# Patient Record
Sex: Male | Born: 1970 | Race: White | Hispanic: No | Marital: Married | Smoking: Former smoker
Health system: Southern US, Community
[De-identification: ages and names within clinical notes are randomized; demographics above are authoritative.]

## PROBLEM LIST (undated history)

## (undated) DIAGNOSIS — E7800 Pure hypercholesterolemia, unspecified: Secondary | ICD-10-CM

## (undated) DIAGNOSIS — G4730 Sleep apnea, unspecified: Secondary | ICD-10-CM

## (undated) DIAGNOSIS — K5900 Constipation, unspecified: Secondary | ICD-10-CM

## (undated) DIAGNOSIS — K429 Umbilical hernia without obstruction or gangrene: Secondary | ICD-10-CM

## (undated) HISTORY — PX: APPENDECTOMY: SHX54

---

## 1998-02-10 ENCOUNTER — Other Ambulatory Visit: Admission: RE | Admit: 1998-02-10 | Discharge: 1998-02-10 | Payer: Self-pay | Admitting: Obstetrics and Gynecology

## 2002-09-12 ENCOUNTER — Emergency Department (HOSPITAL_COMMUNITY): Admission: EM | Admit: 2002-09-12 | Discharge: 2002-09-12 | Payer: Self-pay | Admitting: Emergency Medicine

## 2002-09-12 ENCOUNTER — Encounter: Payer: Self-pay | Admitting: Emergency Medicine

## 2003-11-20 ENCOUNTER — Emergency Department (HOSPITAL_COMMUNITY): Admission: EM | Admit: 2003-11-20 | Discharge: 2003-11-21 | Payer: Self-pay | Admitting: Emergency Medicine

## 2006-03-18 ENCOUNTER — Emergency Department (HOSPITAL_COMMUNITY): Admission: EM | Admit: 2006-03-18 | Discharge: 2006-03-19 | Payer: Self-pay | Admitting: Emergency Medicine

## 2007-09-03 IMAGING — CT CT ABDOMEN W/ CM
2 of 5 series · 16 of 46 positions shown, 18 images · IV contrast (APPLIED)
Comparison: none

CLINICAL DATA: 35 year-old vomiting and abdominal pain.
ABDOMEN CT WITH CONTRAST:
TECHNIQUE: Multidetector CT imaging of the abdomen was performed following the standard protocol during bolus administration of intravenous contrast.
Contrast:  125 cc Omnipaque 300
TECHNIQUE: Multidetector CT imaging of the pelvis was performed following the standard protocol during bolus administration of intravenous contrast.
The rectum, sigmoid colon, and visualized small bowel loops demonstrate no significant findings.  There is a moderate amount of stool in the descending and sigmoid colon.  The bladder appears normal.  Prostate gland and seminal vesicles are unremarkable.  No pelvic masses, adenopathy, or free pelvic fluid collections.  I do not identify the appendix for certain but I do not see any findings to suggest appendicitis. 
No significant bony findings.

[Series 2: abd_pel 5.0 b40f st · axial · 0.69mm/px · z∈[-506,-76]mm · 13 of 98 slices shown, 15 images]
[im 6/98  soft-tissue]
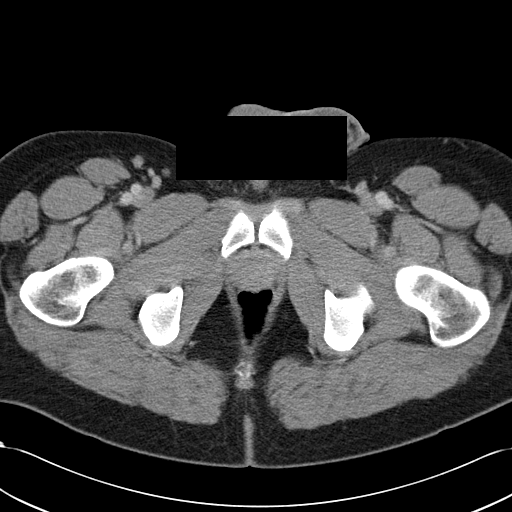
[im 6/98  bone]
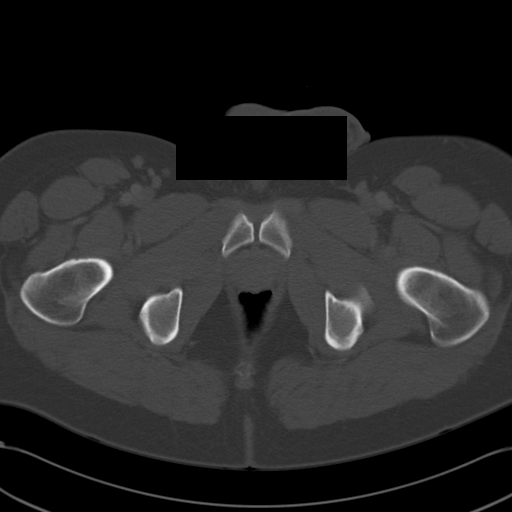
[im 16/98  soft-tissue]
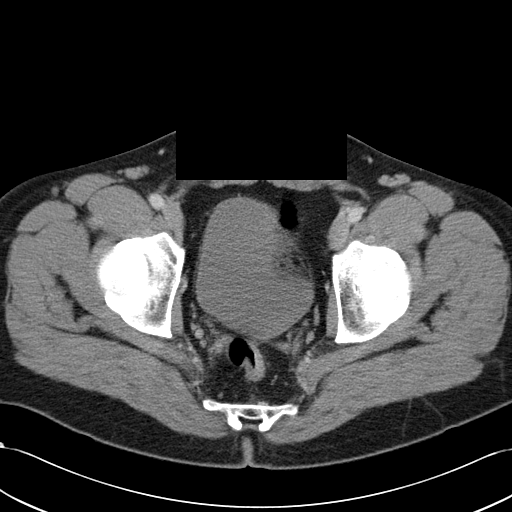
[im 21/98  soft-tissue]
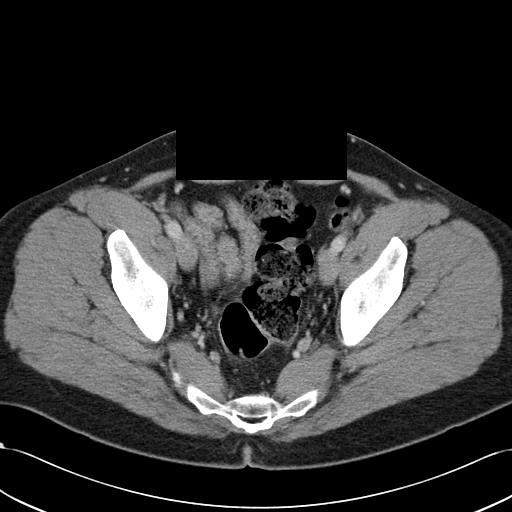
[im 26/98  soft-tissue]
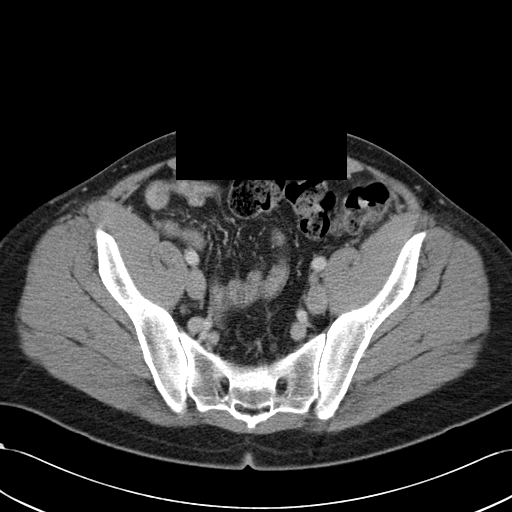
[im 36/98  soft-tissue]
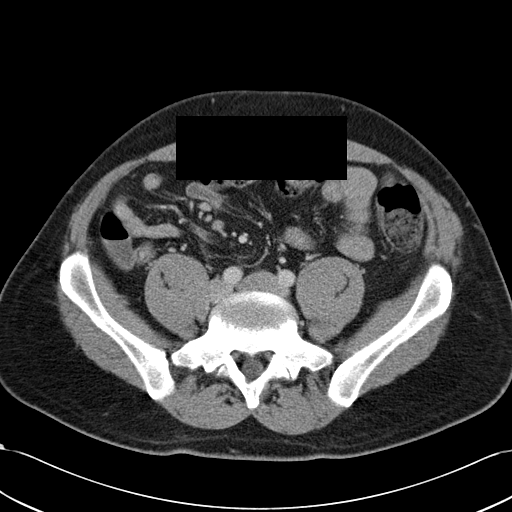
[im 41/98  soft-tissue]
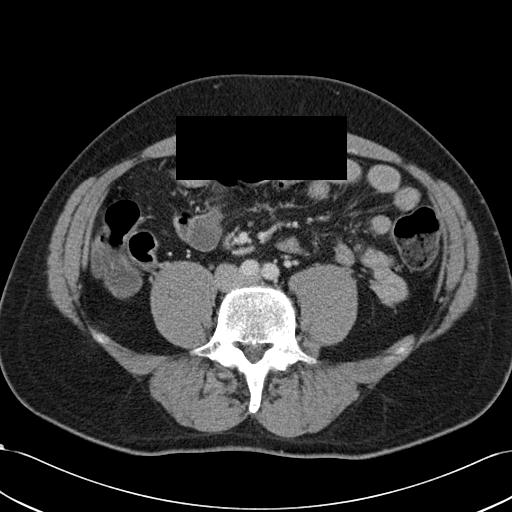
[im 52/98  soft-tissue]
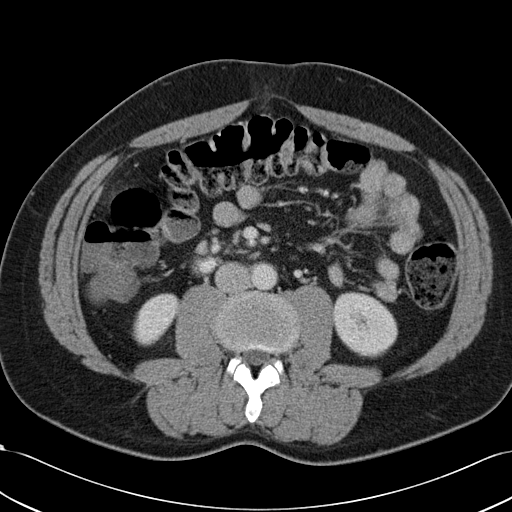
[im 57/98  soft-tissue]
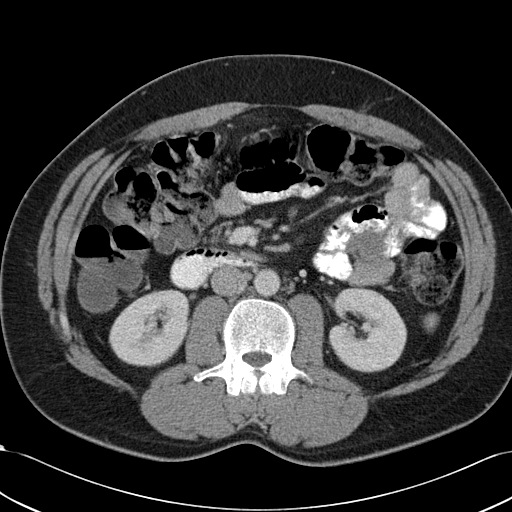
[im 62/98  soft-tissue]
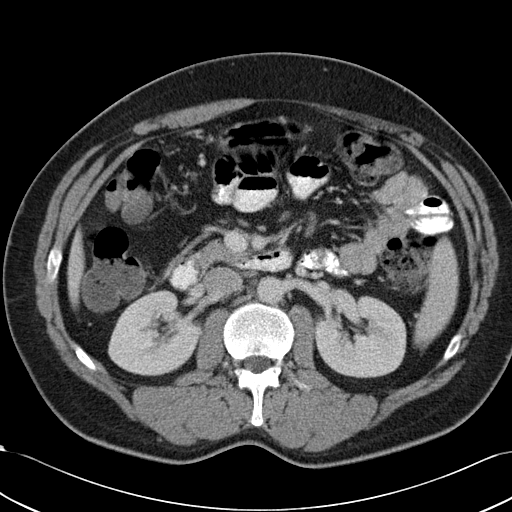
[im 62/98  bone]
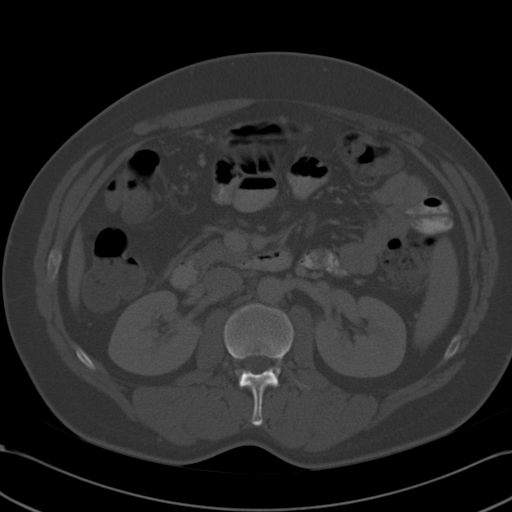
[im 72/98  soft-tissue]
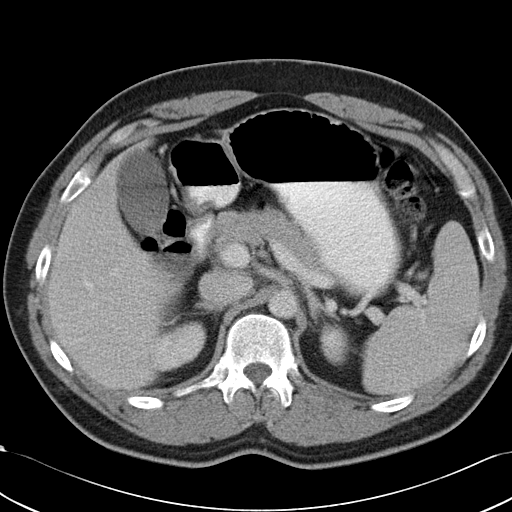
[im 77/98  soft-tissue]
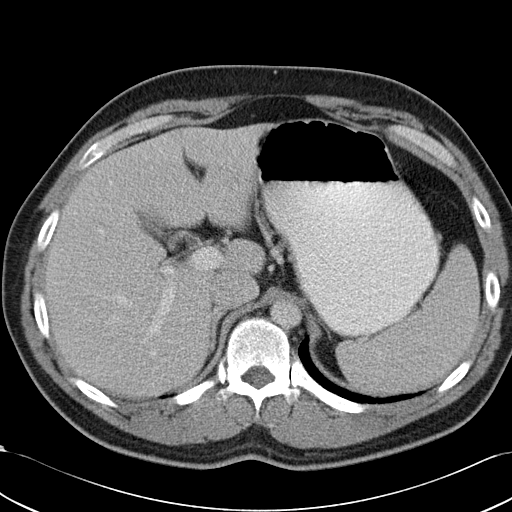
[im 82/98  soft-tissue]
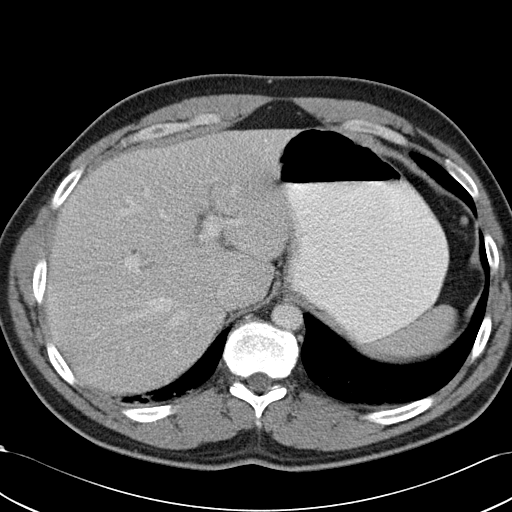
[im 92/98  soft-tissue]
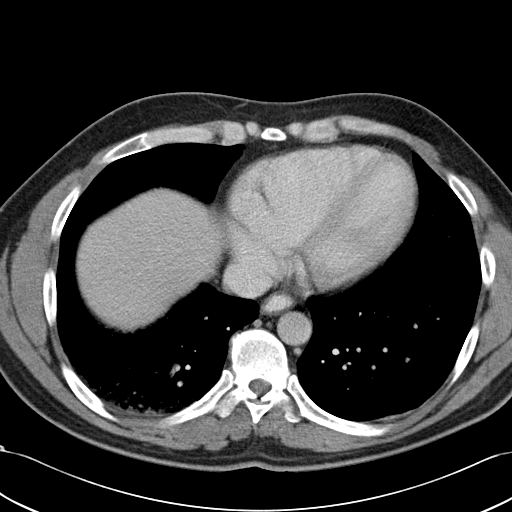

[Series 602: coronal abdomen · coronal · 0.99mm/px · 3 of 130 slices shown]
[im 44/130  soft-tissue]
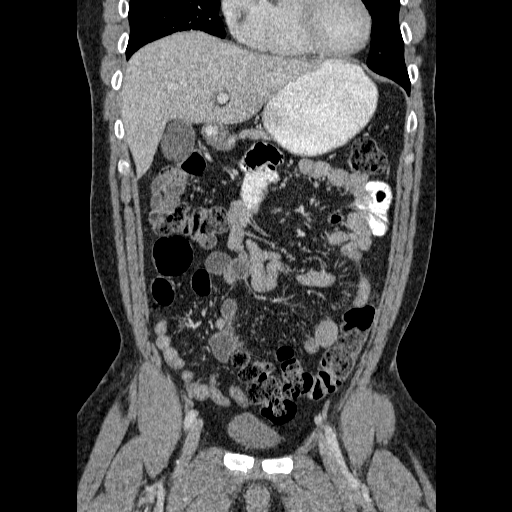
[im 58/130  soft-tissue]
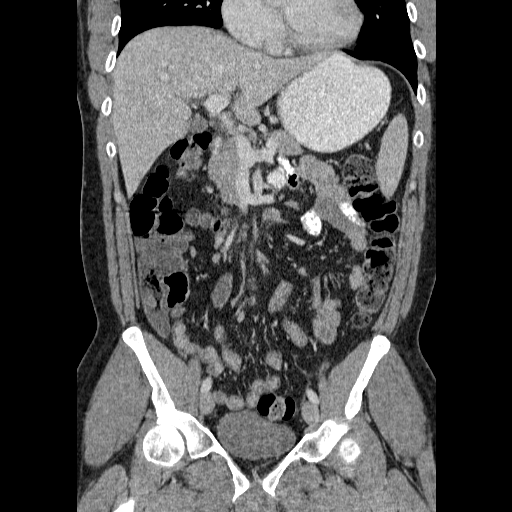
[im 72/130  soft-tissue]
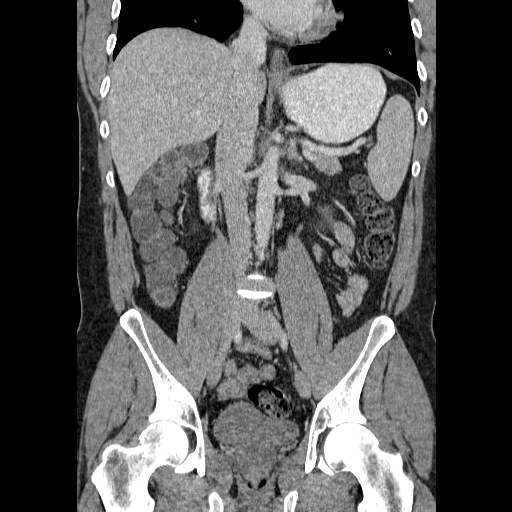

[16 of 46 positions shown; findings below may reference images not displayed]

FINDINGS: Lung bases demonstrate patchy dependent atelectasis right greater than left.  No effusions.  Esophagus is unremarkable.  Heart size is normal.  Descending thoracic aorta is normal in caliber.
The liver appears normal.  No intrahepatic biliary dilatation.  The gallbladder is unremarkable.  The spleen is normal in size.  The pancreas demonstrates no abnormalities.  The adrenal glands and kidneys are normal.  The stomach is well distended with contrast and demonstrates no abnormalities.  The duodenum, small bowel, and colon are unremarkable.  The aorta is normal in caliber.  The major branch vessels are normal.  The portal and splenic veins are patent.  There are scattered small mesenteric and retroperitoneal lymph nodes and a few slightly larger pericecal nodes.  The findings may suggest mesenteric adenitis.
No significant bony findings.
IMPRESSION: 1.  No acute abdominal findings.  There are scattered mesenteric and retroperitoneal lymph nodes which could reflect mesenteric adenitis.  
2.  Bibasilar atelectasis.
3.  Small splenules are noted.  
PELVIS CT WITH CONTRAST:
IMPRESSION: 1.  No CT evidence for acute pelvic process.  The appendix is not identified but I do not see any findings to suggest appendicitis.

## 2010-12-13 ENCOUNTER — Ambulatory Visit: Payer: Self-pay | Admitting: Licensed Clinical Social Worker

## 2010-12-14 ENCOUNTER — Ambulatory Visit (INDEPENDENT_AMBULATORY_CARE_PROVIDER_SITE_OTHER): Payer: 59 | Admitting: Licensed Clinical Social Worker

## 2010-12-14 DIAGNOSIS — F331 Major depressive disorder, recurrent, moderate: Secondary | ICD-10-CM

## 2010-12-28 ENCOUNTER — Ambulatory Visit (INDEPENDENT_AMBULATORY_CARE_PROVIDER_SITE_OTHER): Payer: 59 | Admitting: Licensed Clinical Social Worker

## 2010-12-28 DIAGNOSIS — F331 Major depressive disorder, recurrent, moderate: Secondary | ICD-10-CM

## 2011-01-05 ENCOUNTER — Ambulatory Visit (INDEPENDENT_AMBULATORY_CARE_PROVIDER_SITE_OTHER): Payer: 59 | Admitting: Licensed Clinical Social Worker

## 2011-01-05 DIAGNOSIS — F331 Major depressive disorder, recurrent, moderate: Secondary | ICD-10-CM

## 2011-01-14 ENCOUNTER — Ambulatory Visit: Payer: 59 | Admitting: Licensed Clinical Social Worker

## 2012-05-17 ENCOUNTER — Ambulatory Visit (INDEPENDENT_AMBULATORY_CARE_PROVIDER_SITE_OTHER): Payer: 59 | Admitting: General Surgery

## 2012-05-17 ENCOUNTER — Encounter (INDEPENDENT_AMBULATORY_CARE_PROVIDER_SITE_OTHER): Payer: Self-pay | Admitting: General Surgery

## 2012-05-17 VITALS — BP 132/70 | HR 60 | Temp 97.4°F | Resp 16 | Ht 70.0 in | Wt 269.6 lb

## 2012-05-17 DIAGNOSIS — K429 Umbilical hernia without obstruction or gangrene: Secondary | ICD-10-CM

## 2012-05-17 NOTE — Progress Notes (Signed)
Subjective:     Patient ID: Victor Alexander, male   DOB: 09-Jul-1971, 41 y.o.   MRN: [id number]  HPI The patient's perineal male with approximately a year history of an umbilical hernia. He states over the last several months the area more tender to palpation. He's had no signs of obstruction or incarceration during this time. His diet no nausea no vomiting  Review of Systems  Constitutional: Negative.   HENT: Negative.   Eyes: Negative.   Respiratory: Negative.   Cardiovascular: Negative.   Gastrointestinal: Negative.   Neurological: Negative.        Objective:   Physical Exam  Constitutional: He appears well-developed and well-nourished.  HENT:  Head: Normocephalic and atraumatic.  Eyes: Conjunctivae normal and EOM are normal. Pupils are equal, round, and reactive to light.  Neck: Normal range of motion. Neck supple.  Cardiovascular: Normal rate and regular rhythm.   Pulmonary/Chest: Effort normal and breath sounds normal.  Abdominal: Soft. He exhibits no mass. There is no tenderness. There is no rebound and no guarding.         Reducible 1 cm umbilical hernia.  Musculoskeletal: Normal range of motion.       Assessment:     41 year old male with umbilical hernia 1.0 cm    Plan:     1. We will schedule patient for laparoscopic umbilical hernia with Mesh.  2. All risks and benefits were discussed with the patient, to generally include infection, bleeding, damage to surrounding structures, and recurrence. Alternatives were offered and described.  All questions were answered and the patient voiced understanding of the procedure and wishes to proceed at this point.

## 2012-06-14 ENCOUNTER — Telehealth (INDEPENDENT_AMBULATORY_CARE_PROVIDER_SITE_OTHER): Payer: Self-pay | Admitting: General Surgery

## 2012-06-14 ENCOUNTER — Encounter (INDEPENDENT_AMBULATORY_CARE_PROVIDER_SITE_OTHER): Payer: Self-pay | Admitting: General Surgery

## 2012-06-14 NOTE — Telephone Encounter (Signed)
Called pt and told him that he can go to school while he is on FMLA and he stated that he needed a note and I printed out a note and mailed to patient home

## 2012-06-27 ENCOUNTER — Telehealth (INDEPENDENT_AMBULATORY_CARE_PROVIDER_SITE_OTHER): Payer: Self-pay | Admitting: General Surgery

## 2012-07-02 DIAGNOSIS — K439 Ventral hernia without obstruction or gangrene: Secondary | ICD-10-CM

## 2012-07-02 HISTORY — PX: HERNIA REPAIR: SHX51

## 2012-07-17 ENCOUNTER — Encounter (INDEPENDENT_AMBULATORY_CARE_PROVIDER_SITE_OTHER): Payer: Self-pay | Admitting: General Surgery

## 2012-07-17 ENCOUNTER — Ambulatory Visit (INDEPENDENT_AMBULATORY_CARE_PROVIDER_SITE_OTHER): Payer: 59 | Admitting: General Surgery

## 2012-07-17 VITALS — BP 140/98 | HR 96 | Temp 96.8°F | Ht 70.0 in | Wt 271.5 lb

## 2012-07-17 DIAGNOSIS — Z09 Encounter for follow-up examination after completed treatment for conditions other than malignant neoplasm: Secondary | ICD-10-CM

## 2012-07-17 NOTE — Progress Notes (Signed)
Patient ID: Victor Alexander, male   DOB: [birth year], 41 y.o.   MRN: [id number] The patient is an old male status post laparoscopic umbilical hernia repair. Patient didn't do well postoperatively with minimal pain.  On exam: Wounds are clean dry and intact. There is no hernial palpation. A small seroma at the previous hernia site.  Assessment and plan: Patient okay returned to work on January 3 light-duty. Patient returned full work for one month.  Patient can followup when necessary

## 2012-07-19 ENCOUNTER — Encounter (INDEPENDENT_AMBULATORY_CARE_PROVIDER_SITE_OTHER): Payer: 59 | Admitting: General Surgery

## 2012-07-31 ENCOUNTER — Telehealth (INDEPENDENT_AMBULATORY_CARE_PROVIDER_SITE_OTHER): Payer: Self-pay | Admitting: General Surgery

## 2012-07-31 NOTE — Telephone Encounter (Signed)
Pt called to ask about using a new work-out he received for Christmas.  He is recovering from umbilical hernia repair.  Admonished pt to wait at least 4 weeks to even consider a new work-out and may need to wait longer.  Asked him to use extreme caution; if is hurts, strains or burns stop immediately.  He understands and states he will comply.

## 2014-04-02 ENCOUNTER — Encounter (INDEPENDENT_AMBULATORY_CARE_PROVIDER_SITE_OTHER): Payer: BC Managed Care – PPO | Admitting: General Surgery

## 2014-06-12 ENCOUNTER — Emergency Department (HOSPITAL_COMMUNITY)
Admission: EM | Admit: 2014-06-12 | Discharge: 2014-06-12 | Disposition: A | Discharge status: Home / Self Care | Payer: BC Managed Care – PPO | Attending: Emergency Medicine | Admitting: Emergency Medicine

## 2014-06-12 ENCOUNTER — Encounter (HOSPITAL_COMMUNITY): Payer: Self-pay

## 2014-06-12 ENCOUNTER — Emergency Department (HOSPITAL_COMMUNITY): Payer: BC Managed Care – PPO

## 2014-06-12 DIAGNOSIS — K5900 Constipation, unspecified: Secondary | ICD-10-CM

## 2014-06-12 DIAGNOSIS — R112 Nausea with vomiting, unspecified: Secondary | ICD-10-CM | POA: Insufficient documentation

## 2014-06-12 DIAGNOSIS — K429 Umbilical hernia without obstruction or gangrene: Secondary | ICD-10-CM

## 2014-06-12 DIAGNOSIS — K469 Unspecified abdominal hernia without obstruction or gangrene: Secondary | ICD-10-CM | POA: Diagnosis present

## 2014-06-12 DIAGNOSIS — Z87891 Personal history of nicotine dependence: Secondary | ICD-10-CM | POA: Diagnosis not present

## 2014-06-12 DIAGNOSIS — Z8669 Personal history of other diseases of the nervous system and sense organs: Secondary | ICD-10-CM | POA: Diagnosis not present

## 2014-06-12 HISTORY — DX: Umbilical hernia without obstruction or gangrene: K42.9

## 2014-06-12 HISTORY — DX: Sleep apnea, unspecified: G47.30

## 2014-06-12 LAB — CBC WITH DIFFERENTIAL/PLATELET
BASOS ABS: 0 10*3/uL (ref 0.0–0.1)
BASOS PCT: 0 % (ref 0–1)
Eosinophils Absolute: 0.2 10*3/uL (ref 0.0–0.7)
Eosinophils Relative: 1 % (ref 0–5)
HEMATOCRIT: 44 % (ref 39.0–52.0)
HEMOGLOBIN: 14.9 g/dL (ref 13.0–17.0)
LYMPHS PCT: 16 % (ref 12–46)
Lymphs Abs: 1.7 10*3/uL (ref 0.7–4.0)
MCH: 29.9 pg (ref 26.0–34.0)
MCHC: 33.9 g/dL (ref 30.0–36.0)
MCV: 88.2 fL (ref 78.0–100.0)
MONO ABS: 0.8 10*3/uL (ref 0.1–1.0)
Monocytes Relative: 7 % (ref 3–12)
NEUTROS ABS: 8.1 10*3/uL — AB (ref 1.7–7.7)
Neutrophils Relative %: 76 % (ref 43–77)
Platelets: 278 10*3/uL (ref 150–400)
RBC: 4.99 MIL/uL (ref 4.22–5.81)
RDW: 12.6 % (ref 11.5–15.5)
WBC: 10.8 10*3/uL — AB (ref 4.0–10.5)

## 2014-06-12 LAB — COMPREHENSIVE METABOLIC PANEL
ALBUMIN: 4.4 g/dL (ref 3.5–5.2)
ALK PHOS: 83 U/L (ref 39–117)
ALT: 29 U/L (ref 0–53)
AST: 22 U/L (ref 0–37)
Anion gap: 13 (ref 5–15)
BILIRUBIN TOTAL: 0.4 mg/dL (ref 0.3–1.2)
BUN: 15 mg/dL (ref 6–23)
CHLORIDE: 101 meq/L (ref 96–112)
CO2: 26 mEq/L (ref 19–32)
Calcium: 9.9 mg/dL (ref 8.4–10.5)
Creatinine, Ser: 0.89 mg/dL (ref 0.50–1.35)
GFR calc Af Amer: >90 mL/min (ref >90)
Glucose, Bld: 106 mg/dL — ABNORMAL HIGH (ref 70–99)
POTASSIUM: 4.2 meq/L (ref 3.7–5.3)
Sodium: 140 mEq/L (ref 137–147)
Total Protein: 7.7 g/dL (ref 6.0–8.3)

## 2014-06-12 LAB — LIPASE, BLOOD: Lipase: 12 U/L (ref 11–59)

## 2014-06-12 MED ORDER — OXYCODONE-ACETAMINOPHEN 5-325 MG PO TABS
1.0000 | ORAL_TABLET | Freq: Once | ORAL | Status: AC
  Administered 2014-06-12: 1 via ORAL
  Filled 2014-06-12: qty 1

## 2014-06-12 MED ORDER — HYDROCODONE-ACETAMINOPHEN 5-325 MG PO TABS: 1.0000 | ORAL_TABLET | Freq: Four times a day (QID) | ORAL | Status: DC | PRN

## 2014-06-12 MED ORDER — ONDANSETRON 4 MG PO TBDP
8.0000 mg | ORAL_TABLET | Freq: Once | ORAL | Status: AC
  Administered 2014-06-12: 8 mg via ORAL
  Filled 2014-06-12: qty 2

## 2014-06-12 MED ORDER — MAGNESIUM CITRATE PO SOLN: 1.0000 | Freq: Once | ORAL | Status: DC

## 2014-06-12 MED ORDER — IOHEXOL 300 MG/ML  SOLN
100.0000 mL | Freq: Once | INTRAMUSCULAR | Status: AC | PRN
  Administered 2014-06-12: 100 mL via INTRAVENOUS

## 2014-06-12 MED ORDER — IOHEXOL 300 MG/ML  SOLN
25.0000 mL | Freq: Once | INTRAMUSCULAR | Status: AC | PRN
  Administered 2014-06-12: 25 mL via ORAL

## 2014-06-12 NOTE — ED Notes (Signed)
Pt finished drinking oral contrast. Attempted to notify CT. Phone line busy.

## 2014-06-12 NOTE — Discharge Instructions (Signed)
Your CT scan is normal except for constipation. Take miralax - sold over the counter- daily. Take magnesium citrate once. Do not take and go to work or somewhere where you may not have an easy bathroom access. Take pain medications if it becomes painful. Return to ER if pain not improved with at home treatment.    Hernia A hernia occurs when an internal organ pushes out through a weak spot in the abdominal wall. Hernias most commonly occur in the groin and around the navel. Hernias often can be pushed back into place (reduced). Most hernias tend to get worse over time. Some abdominal hernias can get stuck in the opening (irreducible or incarcerated hernia) and cannot be reduced. An irreducible abdominal hernia which is tightly squeezed into the opening is at risk for impaired blood supply (strangulated hernia). A strangulated hernia is a medical emergency. Because of the risk for an irreducible or strangulated hernia, surgery may be recommended to repair a hernia. CAUSES   Heavy lifting.  Prolonged coughing.  Straining to have a bowel movement.  A cut (incision) made during an abdominal surgery. HOME CARE INSTRUCTIONS   Bed rest is not required. You may continue your normal activities.  Avoid lifting more than 10 pounds (4.5 kg) or straining.  Cough gently. If you are a smoker it is best to stop. Even the best hernia repair can break down with the continual strain of coughing. Even if you do not have your hernia repaired, a cough will continue to aggravate the problem.  Do not wear anything tight over your hernia. Do not try to keep it in with an outside bandage or truss. These can damage abdominal contents if they are trapped within the hernia sac.  Eat a normal diet.  Avoid constipation. Straining over long periods of time will increase hernia size and encourage breakdown of repairs. If you cannot do this with diet alone, stool softeners may be used. SEEK IMMEDIATE MEDICAL CARE IF:    You have a fever.  You develop increasing abdominal pain.  You feel nauseous or vomit.  Your hernia is stuck outside the abdomen, looks discolored, feels hard, or is tender.  You have any changes in your bowel habits or in the hernia that are unusual for you.  You have increased pain or swelling around the hernia.  You cannot push the hernia back in place by applying gentle pressure while lying down. MAKE SURE YOU:   Understand these instructions.  Will watch your condition.  Will get help right away if you are not doing well or get worse. Document Released: 07/18/2005 Document Revised: 10/10/2011 Document Reviewed: 03/06/2008 [facility] Patient Information 2015 [employer] [place]. This information is not intended to replace advice given to you by your health care provider. Make sure you discuss any questions you have with your health care provider.  Constipation Constipation is when a person has fewer than three bowel movements a week, has difficulty having a bowel movement, or has stools that are dry, hard, or larger than normal. As people grow older, constipation is more common. If you try to fix constipation with medicines that make you have a bowel movement (laxatives), the problem may get worse. Long-term laxative use may cause the muscles of the colon to become weak. A low-fiber diet, not taking in enough fluids, and taking certain medicines may make constipation worse.  CAUSES   Certain medicines, such as antidepressants, pain medicine, iron supplements, antacids, and water pills.   Certain diseases, such  as diabetes, irritable bowel syndrome (IBS), thyroid disease, or depression.   Not drinking enough water.   Not eating enough fiber-rich foods.   Stress or travel.   Lack of physical activity or exercise.   Ignoring the urge to have a bowel movement.   Using laxatives too much.  SIGNS AND SYMPTOMS   Having fewer than three bowel movements a week.    Straining to have a bowel movement.   Having stools that are hard, dry, or larger than normal.   Feeling full or bloated.   Pain in the lower abdomen.   Not feeling relief after having a bowel movement.  DIAGNOSIS  Your health care provider will take a medical history and perform a physical exam. Further testing may be done for severe constipation. Some tests may include:  A barium enema X-ray to examine your rectum, colon, and, sometimes, your small intestine.   A sigmoidoscopy to examine your lower colon.   A colonoscopy to examine your entire colon. TREATMENT  Treatment will depend on the severity of your constipation and what is causing it. Some dietary treatments include drinking more fluids and eating more fiber-rich foods. Lifestyle treatments may include regular exercise. If these diet and lifestyle recommendations do not help, your health care provider may recommend taking over-the-counter laxative medicines to help you have bowel movements. Prescription medicines may be prescribed if over-the-counter medicines do not work.  HOME CARE INSTRUCTIONS   Eat foods that have a lot of fiber, such as fruits, vegetables, whole grains, and beans.  Limit foods high in fat and processed sugars, such as french fries, hamburgers, cookies, candies, and soda.   A fiber supplement may be added to your diet if you cannot get enough fiber from foods.   Drink enough fluids to keep your urine clear or pale yellow.   Exercise regularly or as directed by your health care provider.   Go to the restroom when you have the urge to go. Do not hold it.   Only take over-the-counter or prescription medicines as directed by your health care provider. Do not take other medicines for constipation without talking to your health care provider first.  SEEK IMMEDIATE MEDICAL CARE IF:   You have bright red blood in your stool.   Your constipation lasts for more than 4 days or gets worse.    You have abdominal or rectal pain.   You have thin, pencil-like stools.   You have unexplained weight loss. MAKE SURE YOU:   Understand these instructions.  Will watch your condition.  Will get help right away if you are not doing well or get worse. Document Released: 04/15/2004 Document Revised: 07/23/2013 Document Reviewed: 04/29/2013 [facility] Patient Information 2015 [place], [place]. This information is not intended to replace advice given to you by your health care provider. Make sure you discuss any questions you have with your health care provider.

## 2014-06-12 NOTE — ED Provider Notes (Signed)
CSN: [id number]     Arrival date & time 11/12/[id number]  1539 History   First MD Initiated Contact with Patient 06/12/14 1714     Chief Complaint  Patient presents with  . Hernia  . Emesis     (Consider location/radiation/quality/duration/timing/severity/associated sxs/prior Treatment) HPI Victor Alexander is a 43 y.o. male Who presents to emergency department complaining of abdominal pain. Patient with history of umbilical hernia, states this morning around 9 AM he felt it come out and become very hard and painful. States he tried at home to get it reduced, however he was unable to do so. Patient is scheduled for surgery for hernia repair on 07/29/2014. He called his surgery office who told him to come to the emergency department. Patient reports associated nausea and vomiting. No bowel movement in the last couple days. Still passing gas. He states while in the waiting room he received Percocet and Zofran for his symptoms, and states shortly after that the hernia went down. He continues to report pain around his hernia site. He states his pain is much better though.Denis fever, chills, malaise.   Past Medical History  Diagnosis Date  . Sleep apnea   . Hernia, umbilical    Past Surgical History  Procedure Laterality Date  . Appendectomy    . Hernia repair  07/02/12    Lap Umbilical hernia repair   No family history on file. History  Substance Use Topics  . Smoking status: Former Games developermoker  . Smokeless tobacco: Not on file  . Alcohol Use: Yes     Comment: once per month    Review of Systems  Constitutional: Negative for fever and chills.  Respiratory: Negative for cough, chest tightness and shortness of breath.   Cardiovascular: Negative for chest pain, palpitations and leg swelling.  Gastrointestinal: Positive for nausea, vomiting and abdominal pain. Negative for diarrhea and abdominal distention.  Genitourinary: Negative for dysuria, frequency and hematuria.  Musculoskeletal: Negative  for neck pain and neck stiffness.  Skin: Negative for rash.  Allergic/Immunologic: Negative for immunocompromised state.  Neurological: Negative for dizziness, weakness, light-headedness, numbness and headaches.  All other systems reviewed and are negative.     Allergies  Review of patient's allergies indicates no known allergies.  Home Medications   Prior to Admission medications   Not on File   BP 152/94 mmHg  Pulse 80  Temp(Src) 97.9 F (36.6 C) (Oral)  Resp 16  SpO2 100% Physical Exam  Constitutional: He appears well-developed and well-nourished. No distress.  HENT:  Head: Normocephalic and atraumatic.  Eyes: Conjunctivae are normal.  Neck: Neck supple.  Cardiovascular: Normal rate, regular rhythm and normal heart sounds.   Pulmonary/Chest: Effort normal. No respiratory distress. He has no wheezes. He has no rales.  Abdominal: Soft. Bowel sounds are normal. He exhibits no distension. There is tenderness. There is no rebound.  Umbilical hernia present, soft, reducible. Tenderness over the hernia and just surrounding the hernia site. Abdomen is soft, no guarding or rebound tenderness  Musculoskeletal: He exhibits no edema.  Neurological: He is alert.  Skin: Skin is warm and dry.  Nursing note and vitals reviewed.   ED Course  Procedures (including critical care time) Labs Review Labs Reviewed  CBC WITH DIFFERENTIAL - Abnormal; Notable for the following:    WBC 10.8 (*)    Neutro Abs 8.1 (*)    All other components within normal limits  COMPREHENSIVE METABOLIC PANEL - Abnormal; Notable for the following:    Glucose, Bld 106 (*)  All other components within normal limits  LIPASE, BLOOD    Imaging Review Ct Abdomen Pelvis W Contrast  06/12/2014   CLINICAL DATA:  Abdominal pain.  Evaluate umbilical hernia.  EXAM: CT ABDOMEN AND PELVIS WITH CONTRAST  TECHNIQUE: Multidetector CT imaging of the abdomen and pelvis was performed using the standard protocol following  bolus administration of intravenous contrast.  CONTRAST:  100mL OMNIPAQUE IOHEXOL 300 MG/ML  SOLN  COMPARISON:  03/19/2006.  FINDINGS: Lower chest: Lung bases show no acute findings. Heart size normal. No pericardial or pleural effusion.  Hepatobiliary: Liver and gallbladder are unremarkable. No biliary ductal dilatation.  Pancreas: Negative.  Spleen: Negative.  Adrenals/Urinary Tract: Adrenal glands and right kidney are unremarkable. 10 mm low-attenuation lesion in the upper pole left kidney is difficult to definitively characterize due to size and lack of precontrast imaging but statistically, a cyst is most likely. Ureters are decompressed. Bladder is grossly unremarkable.  Stomach/Bowel: Stomach and small bowel are unremarkable. Patient is reportedly status post appendectomy. A fair amount of stool is seen in the colon which is otherwise unremarkable.  Vascular/Lymphatic: Vascular structures are grossly unremarkable. No pathologically enlarged lymph nodes.  Reproductive: Prostate is normal in size.  Other: Periumbilical hernia contains fat and omentum, measuring 5.6 x 7.5 cm. The hernia neck measures 2.9 cm. No free fluid. Mesenteries and peritoneum are otherwise grossly unremarkable.  Musculoskeletal: No worrisome lytic or sclerotic lesions.  IMPRESSION: 1. Periumbilical hernia contains fat and omentum. 2. Fair amount of stool in the colon is indicative of constipation.   Electronically Signed   By: Leanna BattlesMelinda  Blietz M.D.   On: 06/12/2014 19:41     EKG Interpretation None      MDM   Final diagnoses:  Abdominal hernia  Constipation, unspecified constipation type   Patient with what sounds like incarcerated hernia which is now reduced. He continues to have abdominal pain. This is patient's fourth episode of bulging of his hernia in the last 4 months with fourth ER trip. His surgery is not for another month and a half. Will get labs, CT abdomen and pelvis, contact surgery. Pain is currently down to  2/10 after percocet  8:10 PM Patient CT scan is unremarkable except for constipation. Patient is still pain-free. He is agreeable to going home and following up outpatient. He is asking for pain medications and case pain recurs. Advised to also take Maalox and magnesium citrate for his constipation, and follow with general surgery. Return precautions discussed.  Filed Vitals:   06/12/14 1547 06/12/14 1823 06/12/14 1900  BP: 152/94 133/83 128/73  Pulse: 80 63 65  Temp: 97.9 F (36.6 C)    TempSrc: Oral    Resp: 16 18 17   SpO2: 100% 96% 95%     Lottie Musselatyana A Jejuan Scala, PA-C 06/12/14 2011  Nathan R. Rubin PayorPickering, MD 06/13/14 [phone]

## 2014-06-12 NOTE — ED Notes (Signed)
CT notified patient finished oral contrast.

## 2014-06-12 NOTE — ED Notes (Signed)
Scheduled for surgery on 12/29. Pain has been increased for the past 6 hours and is hard. Surgeon office told him to come here. Vomiting. No diarrhea.

## 2014-06-25 ENCOUNTER — Ambulatory Visit (INDEPENDENT_AMBULATORY_CARE_PROVIDER_SITE_OTHER): Payer: Self-pay | Admitting: General Surgery

## 2014-06-30 NOTE — Pre-Procedure Instructions (Signed)
Eveline KetoDonnie L Vivero  06/30/2014   Your procedure is scheduled on:  Friday, December 11th  [facility]  Call this number if you have problems the morning of surgery: [phone]   Remember:   Do not eat food or drink liquids after midnight.   Take these medicines the morning of surgery with A SIP OF WATER: pain medication if needed   Do not wear jewelry.  Do not wear lotions, powders, or perfumes,deodorant.  Do not shave 48 hours prior to surgery. Men may shave face and neck.  Do not bring valuables to the hospital.  [facility] is not responsible for any belongings or valuables.               Contacts, dentures or bridgework may not be worn into surgery.  Leave suitcase in the car. After surgery it may be brought to your room.  For patients admitted to the hospital, discharge time is determined by your treatment team.               Patients discharged the day of surgery will not be allowed to drive home.  Please read over the following fact sheets that you were given: Pain Booklet, Coughing and Deep Breathing and Surgical Site Infection Prevention  [place] - Preparing for Surgery  Before surgery, you can play an important role.  Because skin is not sterile, your skin needs to be as free of germs as possible.  You can reduce the number of germs on you skin by washing with CHG (chlorahexidine gluconate) soap before surgery.  CHG is an antiseptic cleaner which kills germs and bonds with the skin to continue killing germs even after washing.  Please DO NOT use if you have an allergy to CHG or antibacterial soaps.  If your skin becomes reddened/irritated stop using the CHG and inform your nurse when you arrive at Short Stay.  Do not shave (including legs and underarms) for at least 48 hours prior to the first CHG shower.  You may shave your face.  Please follow these instructions carefully:   1.  Shower with CHG Soap the night before surgery and  the morning of Surgery.  2.  If you choose to wash your hair, wash your hair first as usual with your normal shampoo.  3.  After you shampoo, rinse your hair and body thoroughly to remove the shampoo.  4.  Use CHG as you would any other liquid soap.  You can apply CHG directly to the skin and wash gently with scrungie or a clean washcloth.  5.  Apply the CHG Soap to your body ONLY FROM THE NECK DOWN.  Do not use on open wounds or open sores.  Avoid contact with your eyes, ears, mouth and genitals (private parts).  Wash genitals (private parts) with your normal soap.  6.  Wash thoroughly, paying special attention to the area where your surgery will be performed.  7.  Thoroughly rinse your body with warm water from the neck down.  8.  DO NOT shower/wash with your normal soap after using and rinsing off the CHG Soap.  9.  Pat yourself dry with a clean towel.            10.  Wear clean pajamas.            11.  Place clean sheets on your bed the night of your first shower and do not sleep with pets.  Day of Surgery  Do not apply any lotions/deoderants the morning of surgery.  Please wear clean clothes to the hospital/surgery center.

## 2014-07-01 ENCOUNTER — Encounter (HOSPITAL_COMMUNITY): Payer: Self-pay

## 2014-07-01 ENCOUNTER — Encounter (HOSPITAL_COMMUNITY)
Admission: RE | Admit: 2014-07-01 | Discharge: 2014-07-01 | Disposition: A | Discharge status: Home / Self Care | Payer: BC Managed Care – PPO | Source: Ambulatory Visit | Attending: General Surgery | Admitting: General Surgery

## 2014-07-01 DIAGNOSIS — G4730 Sleep apnea, unspecified: Secondary | ICD-10-CM | POA: Insufficient documentation

## 2014-07-01 DIAGNOSIS — Z01812 Encounter for preprocedural laboratory examination: Secondary | ICD-10-CM | POA: Diagnosis not present

## 2014-07-01 HISTORY — DX: Constipation, unspecified: K59.00

## 2014-07-01 LAB — CBC
HEMATOCRIT: 41.3 % (ref 39.0–52.0)
HEMOGLOBIN: 13.8 g/dL (ref 13.0–17.0)
MCH: 29.6 pg (ref 26.0–34.0)
MCHC: 33.4 g/dL (ref 30.0–36.0)
MCV: 88.4 fL (ref 78.0–100.0)
Platelets: 220 10*3/uL (ref 150–400)
RBC: 4.67 MIL/uL (ref 4.22–5.81)
RDW: 12.7 % (ref 11.5–15.5)
WBC: 7.1 10*3/uL (ref 4.0–10.5)

## 2014-07-01 NOTE — Progress Notes (Signed)
Patient denied having any cardiac or pulmonary issues. PCP is Catha GosselinKevin Little. Patient informed Nurse that he has sleep apnea and wears a CPAP machine nightly.

## 2014-07-10 MED ORDER — CEFAZOLIN SODIUM 10 G IJ SOLR
3.0000 g | INTRAMUSCULAR | Status: AC
  Administered 2014-07-11: 3 g via INTRAVENOUS
  Filled 2014-07-10: qty 3000

## 2014-07-10 MED ORDER — CHLORHEXIDINE GLUCONATE 4 % EX LIQD
1.0000 "application " | Freq: Once | CUTANEOUS | Status: DC
  Filled 2014-07-10: qty 15

## 2014-07-11 ENCOUNTER — Ambulatory Visit (HOSPITAL_COMMUNITY): Payer: BC Managed Care – PPO | Admitting: Certified Registered Nurse Anesthetist

## 2014-07-11 ENCOUNTER — Ambulatory Visit (HOSPITAL_COMMUNITY)
Admission: RE | Admit: 2014-07-11 | Discharge: 2014-07-11 | Disposition: A | Discharge status: Home / Self Care | Payer: BC Managed Care – PPO | Source: Ambulatory Visit | Attending: General Surgery | Admitting: General Surgery

## 2014-07-11 ENCOUNTER — Encounter (HOSPITAL_COMMUNITY): Payer: Self-pay | Admitting: Surgery

## 2014-07-11 ENCOUNTER — Encounter (HOSPITAL_COMMUNITY)
Admission: RE | Disposition: A | Discharge status: Home / Self Care | Payer: Self-pay | Source: Ambulatory Visit | Attending: General Surgery

## 2014-07-11 DIAGNOSIS — Z87891 Personal history of nicotine dependence: Secondary | ICD-10-CM | POA: Diagnosis not present

## 2014-07-11 DIAGNOSIS — K420 Umbilical hernia with obstruction, without gangrene: Secondary | ICD-10-CM | POA: Insufficient documentation

## 2014-07-11 DIAGNOSIS — G4730 Sleep apnea, unspecified: Secondary | ICD-10-CM | POA: Diagnosis not present

## 2014-07-11 HISTORY — PX: UMBILICAL HERNIA REPAIR: SHX196

## 2014-07-11 HISTORY — PX: INSERTION OF MESH: SHX5868

## 2014-07-11 SURGERY — REPAIR, HERNIA, UMBILICAL, LAPAROSCOPIC
Anesthesia: General | Site: Abdomen

## 2014-07-11 MED ORDER — SODIUM CHLORIDE 0.9 % IR SOLN
Status: DC | PRN
  Administered 2014-07-11: 1000 mL

## 2014-07-11 MED ORDER — BUPIVACAINE HCL 0.25 % IJ SOLN
INTRAMUSCULAR | Status: DC | PRN
  Administered 2014-07-11: 30 mL

## 2014-07-11 MED ORDER — GLYCOPYRROLATE 0.2 MG/ML IJ SOLN
INTRAMUSCULAR | Status: DC | PRN
  Administered 2014-07-11: .6 mg via INTRAVENOUS

## 2014-07-11 MED ORDER — SUCCINYLCHOLINE CHLORIDE 20 MG/ML IJ SOLN
INTRAMUSCULAR | Status: AC
  Filled 2014-07-11: qty 1

## 2014-07-11 MED ORDER — OXYCODONE HCL 5 MG PO TABS: 5.0000 mg | ORAL_TABLET | Freq: Once | ORAL | Status: DC | PRN

## 2014-07-11 MED ORDER — PHENYLEPHRINE 40 MCG/ML (10ML) SYRINGE FOR IV PUSH (FOR BLOOD PRESSURE SUPPORT)
PREFILLED_SYRINGE | INTRAVENOUS | Status: AC
  Filled 2014-07-11: qty 10

## 2014-07-11 MED ORDER — NEOSTIGMINE METHYLSULFATE 10 MG/10ML IV SOLN
INTRAVENOUS | Status: DC | PRN
  Administered 2014-07-11: 4 mg via INTRAVENOUS

## 2014-07-11 MED ORDER — HYDROMORPHONE HCL 1 MG/ML IJ SOLN
0.2500 mg | INTRAMUSCULAR | Status: DC | PRN
  Administered 2014-07-11 (×3): 0.25 mg via INTRAVENOUS

## 2014-07-11 MED ORDER — ROCURONIUM BROMIDE 50 MG/5ML IV SOLN
INTRAVENOUS | Status: AC
  Filled 2014-07-11: qty 2

## 2014-07-11 MED ORDER — PROPOFOL 10 MG/ML IV BOLUS
INTRAVENOUS | Status: AC
  Filled 2014-07-11: qty 20

## 2014-07-11 MED ORDER — LIDOCAINE HCL (CARDIAC) 20 MG/ML IV SOLN
INTRAVENOUS | Status: DC | PRN
  Administered 2014-07-11: 100 mg via INTRAVENOUS

## 2014-07-11 MED ORDER — OXYCODONE-ACETAMINOPHEN 7.5-325 MG PO TABS: 1.0000 | ORAL_TABLET | ORAL | Status: DC | PRN

## 2014-07-11 MED ORDER — HYDROMORPHONE HCL 1 MG/ML IJ SOLN
INTRAMUSCULAR | Status: AC
  Filled 2014-07-11: qty 1

## 2014-07-11 MED ORDER — MIDAZOLAM HCL 5 MG/5ML IJ SOLN
INTRAMUSCULAR | Status: DC | PRN
  Administered 2014-07-11: 2 mg via INTRAVENOUS

## 2014-07-11 MED ORDER — CEFAZOLIN SODIUM-DEXTROSE 2-3 GM-% IV SOLR
INTRAVENOUS | Status: AC
  Filled 2014-07-11: qty 50

## 2014-07-11 MED ORDER — ONDANSETRON HCL 4 MG/2ML IJ SOLN
INTRAMUSCULAR | Status: AC
  Filled 2014-07-11: qty 2

## 2014-07-11 MED ORDER — OXYCODONE HCL 5 MG/5ML PO SOLN: 5.0000 mg | Freq: Once | ORAL | Status: DC | PRN

## 2014-07-11 MED ORDER — BUPIVACAINE HCL (PF) 0.25 % IJ SOLN
INTRAMUSCULAR | Status: AC
  Filled 2014-07-11: qty 30

## 2014-07-11 MED ORDER — EPHEDRINE SULFATE 50 MG/ML IJ SOLN
INTRAMUSCULAR | Status: AC
  Filled 2014-07-11: qty 1

## 2014-07-11 MED ORDER — EPHEDRINE SULFATE 50 MG/ML IJ SOLN
INTRAMUSCULAR | Status: DC | PRN
  Administered 2014-07-11: 5 mg via INTRAVENOUS

## 2014-07-11 MED ORDER — ROCURONIUM BROMIDE 100 MG/10ML IV SOLN
INTRAVENOUS | Status: DC | PRN
  Administered 2014-07-11: 50 mg via INTRAVENOUS

## 2014-07-11 MED ORDER — FENTANYL CITRATE 0.05 MG/ML IJ SOLN
INTRAMUSCULAR | Status: DC | PRN
  Administered 2014-07-11: 50 ug via INTRAVENOUS
  Administered 2014-07-11: 150 ug via INTRAVENOUS
  Administered 2014-07-11: 50 ug via INTRAVENOUS

## 2014-07-11 MED ORDER — CEFAZOLIN SODIUM 1-5 GM-% IV SOLN
INTRAVENOUS | Status: AC
  Filled 2014-07-11: qty 50

## 2014-07-11 MED ORDER — ONDANSETRON HCL 4 MG/2ML IJ SOLN: 4.0000 mg | Freq: Four times a day (QID) | INTRAMUSCULAR | Status: DC | PRN

## 2014-07-11 MED ORDER — DEXAMETHASONE SODIUM PHOSPHATE 4 MG/ML IJ SOLN
INTRAMUSCULAR | Status: DC | PRN
  Administered 2014-07-11: 4 mg via INTRAVENOUS

## 2014-07-11 MED ORDER — ARTIFICIAL TEARS OP OINT
TOPICAL_OINTMENT | OPHTHALMIC | Status: AC
  Filled 2014-07-11: qty 3.5

## 2014-07-11 MED ORDER — LIDOCAINE HCL (CARDIAC) 20 MG/ML IV SOLN
INTRAVENOUS | Status: AC
  Filled 2014-07-11: qty 15

## 2014-07-11 MED ORDER — FENTANYL CITRATE 0.05 MG/ML IJ SOLN
INTRAMUSCULAR | Status: AC
Start: 2014-07-11 — End: 2014-07-11
  Filled 2014-07-11: qty 5

## 2014-07-11 MED ORDER — PROPOFOL 10 MG/ML IV BOLUS
INTRAVENOUS | Status: DC | PRN
  Administered 2014-07-11: 120 mg via INTRAVENOUS

## 2014-07-11 MED ORDER — LACTATED RINGERS IV SOLN
INTRAVENOUS | Status: DC | PRN
  Administered 2014-07-11 (×2): via INTRAVENOUS

## 2014-07-11 MED ORDER — DEXTROSE 5 % IV SOLN
10.0000 mg | INTRAVENOUS | Status: DC | PRN
  Administered 2014-07-11: 5 ug/min via INTRAVENOUS

## 2014-07-11 MED ORDER — SODIUM CHLORIDE 0.9 % IJ SOLN
INTRAMUSCULAR | Status: AC
  Filled 2014-07-11: qty 10

## 2014-07-11 MED ORDER — MIDAZOLAM HCL 2 MG/2ML IJ SOLN
INTRAMUSCULAR | Status: AC
  Filled 2014-07-11: qty 2

## 2014-07-11 SURGICAL SUPPLY — 48 items
APPLIER CLIP LOGIC TI 5 (MISCELLANEOUS) IMPLANT
APPLIER CLIP ROT 10 11.4 M/L (STAPLE)
BENZOIN TINCTURE PRP APPL 2/3 (GAUZE/BANDAGES/DRESSINGS) ×3 IMPLANT
BLADE SURG ROTATE 9660 (MISCELLANEOUS) IMPLANT
CANISTER SUCTION 2500CC (MISCELLANEOUS) IMPLANT
CHLORAPREP W/TINT 26ML (MISCELLANEOUS) ×3 IMPLANT
CLIP APPLIE ROT 10 11.4 M/L (STAPLE) IMPLANT
CLOSURE STERI-STRIP 1/2X4 (GAUZE/BANDAGES/DRESSINGS) ×1
CLSR STERI-STRIP ANTIMIC 1/2X4 (GAUZE/BANDAGES/DRESSINGS) ×2 IMPLANT
COVER SURGICAL LIGHT HANDLE (MISCELLANEOUS) ×3 IMPLANT
DEVICE RELIATACK FIXATION (MISCELLANEOUS) ×3 IMPLANT
DEVICE SECURE STRAP 25 ABSORB (INSTRUMENTS) ×3 IMPLANT
DEVICE TROCAR PUNCTURE CLOSURE (ENDOMECHANICALS) ×3 IMPLANT
DRAPE LAPAROSCOPIC ABDOMINAL (DRAPES) ×3 IMPLANT
DRAPE UTILITY XL STRL (DRAPES) ×6 IMPLANT
ELECT REM PT RETURN 9FT ADLT (ELECTROSURGICAL) ×3
ELECTRODE REM PT RTRN 9FT ADLT (ELECTROSURGICAL) ×1 IMPLANT
GAUZE SPONGE 4X4 12PLY STRL (GAUZE/BANDAGES/DRESSINGS) ×3 IMPLANT
GLOVE BIO SURGEON STRL SZ7.5 (GLOVE) ×3 IMPLANT
GOWN STRL REUS W/ TWL LRG LVL3 (GOWN DISPOSABLE) ×2 IMPLANT
GOWN STRL REUS W/ TWL XL LVL3 (GOWN DISPOSABLE) ×1 IMPLANT
GOWN STRL REUS W/TWL LRG LVL3 (GOWN DISPOSABLE) ×4
GOWN STRL REUS W/TWL XL LVL3 (GOWN DISPOSABLE) ×2
KIT BASIN OR (CUSTOM PROCEDURE TRAY) ×3 IMPLANT
KIT ROOM TURNOVER OR (KITS) ×3 IMPLANT
MARKER SKIN DUAL TIP RULER LAB (MISCELLANEOUS) ×3 IMPLANT
MESH PARIETEX 4.7 (Mesh General) ×3 IMPLANT
NEEDLE INSUFFLATION 14GA 120MM (NEEDLE) ×3 IMPLANT
NEEDLE SPNL 22GX3.5 QUINCKE BK (NEEDLE) IMPLANT
NS IRRIG 1000ML POUR BTL (IV SOLUTION) ×3 IMPLANT
PAD ARMBOARD 7.5X6 YLW CONV (MISCELLANEOUS) ×6 IMPLANT
RELOAD RELIATACK 10 (MISCELLANEOUS) ×3 IMPLANT
SCISSORS LAP 5X35 DISP (ENDOMECHANICALS) ×3 IMPLANT
SET IRRIG TUBING LAPAROSCOPIC (IRRIGATION / IRRIGATOR) IMPLANT
SLEEVE ENDOPATH XCEL 5M (ENDOMECHANICALS) ×3 IMPLANT
SUT CHROMIC 2 0 SH (SUTURE) ×3 IMPLANT
SUT ETHIBOND NAB CT1 #1 30IN (SUTURE) ×3 IMPLANT
SUT MNCRL AB 4-0 PS2 18 (SUTURE) ×3 IMPLANT
SUT PROLENE 2 0 KS (SUTURE) IMPLANT
TAPE CLOTH SURG 4X10 WHT LF (GAUZE/BANDAGES/DRESSINGS) ×3 IMPLANT
TOWEL OR 17X24 6PK STRL BLUE (TOWEL DISPOSABLE) ×3 IMPLANT
TOWEL OR 17X26 10 PK STRL BLUE (TOWEL DISPOSABLE) ×3 IMPLANT
TRAY FOLEY CATH 14FR (SET/KITS/TRAYS/PACK) IMPLANT
TRAY LAPAROSCOPIC (CUSTOM PROCEDURE TRAY) ×3 IMPLANT
TROCAR XCEL BLUNT TIP 100MML (ENDOMECHANICALS) IMPLANT
TROCAR XCEL NON-BLD 11X100MML (ENDOMECHANICALS) IMPLANT
TROCAR XCEL NON-BLD 5MMX100MML (ENDOMECHANICALS) ×3 IMPLANT
TUBING INSUFFLATION (TUBING) ×3 IMPLANT

## 2014-07-11 NOTE — Anesthesia Postprocedure Evaluation (Signed)
Anesthesia Post Note  Patient: Victor Alexander  Procedure(s) Performed: Procedure(s) (LRB): LAPAROSCOPIC UMBILICAL HERNIA REPAIR WITH MESH (N/A) INSERTION OF MESH (N/A)  Anesthesia type: general  Patient location: PACU  Post pain: Pain level controlled  Post assessment: Patient's Cardiovascular Status Stable  Last Vitals:  Filed Vitals:   07/11/14 1015  BP: 150/90  Pulse: 78  Temp:   Resp:     Post vital signs: Reviewed and stable  Level of consciousness: sedated  Complications: No apparent anesthesia complications

## 2014-07-11 NOTE — Discharge Instructions (Signed)
[facility] _______[facility] ° °UMBILICAL OR INGUINAL HERNIA REPAIR: POST OP INSTRUCTIONS ° °Always review your discharge instruction sheet given to you by the facility where your surgery was performed. °IF YOU HAVE DISABILITY OR FAMILY LEAVE FORMS, YOU MUST BRING THEM TO THE OFFICE FOR PROCESSING.   °DO NOT GIVE THEM TO YOUR DOCTOR. ° °1. A  prescription for pain medication may be given to you upon discharge.  Take your pain medication as prescribed, if needed.  If narcotic pain medicine is not needed, then you may take acetaminophen (Tylenol) or ibuprofen (Advil) as needed. °2. Take your usually prescribed medications unless otherwise directed. °3. If you need a refill on your pain medication, please contact your pharmacy.  They will contact our office to request authorization. Prescriptions will not be filled after 5 pm or on week-ends. °4. You should follow a light diet the first 24 hours after arrival home, such as soup and crackers, etc.  Be sure to include lots of fluids daily.  Resume your normal diet the day after surgery. °5. Most patients will experience some swelling and bruising around the umbilicus or in the groin and scrotum.  Ice packs and reclining will help.  Swelling and bruising can take several days to resolve.  °6. It is common to experience some constipation if taking pain medication after surgery.  Increasing fluid intake and taking a stool softener (such as Colace) will usually help or prevent this problem from occurring.  A mild laxative (Milk of Magnesia or Miralax) should be taken according to package directions if there are no bowel movements after 48 hours. °7. Unless discharge instructions indicate otherwise, you may remove your bandages 24-48 hours after surgery, and you may shower at that time.  You may have steri-strips (small skin tapes) in place directly over the incision.  These strips should be left on the skin for 7-10 days.  If your surgeon used skin glue on the  incision, you may shower in 24 hours.  The glue will flake off over the next 2-3 weeks.  Any sutures or staples will be removed at the office during your follow-up visit. °8. ACTIVITIES:  You may resume regular (light) daily activities beginning the next day--such as daily self-care, walking, climbing stairs--gradually increasing activities as tolerated.  You may have sexual intercourse when it is comfortable.  Refrain from any heavy lifting or straining until approved by your doctor. °a. You may drive when you are no longer taking prescription pain medication, you can comfortably wear a seatbelt, and you can safely maneuver your car and apply brakes. °b. RETURN TO WORK:  __________________________________________________________ °9. You should see your doctor in the office for a follow-up appointment approximately 2-3 weeks after your surgery.  Make sure that you call for this appointment within a day or two after you arrive home to insure a convenient appointment time. °10. OTHER INSTRUCTIONS:  __________________________________________________________________________________________________________________________________________________________________________________________  °WHEN TO CALL YOUR DOCTOR: °1. Fever over 101.0 °2. Inability to urinate °3. Nausea and/or vomiting °4. Extreme swelling or bruising °5. Continued bleeding from incision. °6. Increased pain, redness, or drainage from the incision ° °The clinic staff is available to answer your questions during regular business hours.  Please don’t hesitate to call and ask to speak to one of the nurses for clinical concerns.  If you have a medical emergency, go to the nearest emergency room or call 911.  A surgeon from [facility] is always on call at the hospital ° ° °  [address], [address], [place], [place]  [address] ? ° [address] [address], [place], [place]   [address] °[phone] ? [phone] ? FAX [phone] °Web site:  [email or web address] ° ° °General Anesthesia, Adult, Care After  °Refer to this sheet in the next few weeks. These instructions provide you with information on caring for yourself after your procedure. Your health care provider may also give you more specific instructions. Your treatment has been planned according to current medical practices, but problems sometimes occur. Call your health care provider if you have any problems or questions after your procedure.  °WHAT TO EXPECT AFTER THE PROCEDURE  °After the procedure, it is typical to experience:  °Sleepiness.  °Nausea and vomiting. °HOME CARE INSTRUCTIONS  °For the first 24 hours after general anesthesia:  °Have a responsible person with you.  °Do not drive a car. If you are alone, do not take public transportation.  °Do not drink alcohol.  °Do not take medicine that has not been prescribed by your health care provider.  °Do not sign important papers or make important decisions.  °You may resume a normal diet and activities as directed by your health care provider.  °Change bandages (dressings) as directed.  °If you have questions or problems that seem related to general anesthesia, call the hospital and ask for the anesthetist or anesthesiologist on call. °SEEK MEDICAL CARE IF:  °You have nausea and vomiting that continue the day after anesthesia.  °You develop a rash. °SEEK IMMEDIATE MEDICAL CARE IF:  °You have difficulty breathing.  °You have chest pain.  °You have any allergic problems. °Document Released: 10/24/2000 Document Revised: 03/20/2013 Document Reviewed: 01/31/2013  °ExitCare® Patient Information ©2014 [employer].  ° ° ° °

## 2014-07-11 NOTE — Anesthesia Procedure Notes (Signed)
Procedure Name: Intubation Date/Time: 07/11/2014 8:06 AM Performed by: Garner Nash Pre-anesthesia Checklist: Patient identified, Timeout performed, Emergency Drugs available, Suction available and Patient being monitored Patient Re-evaluated:Patient Re-evaluated prior to inductionOxygen Delivery Method: Circle system utilized Preoxygenation: Pre-oxygenation with 100% oxygen Intubation Type: IV induction Ventilation: Mask ventilation without difficulty and Oral airway inserted - appropriate to patient size Laryngoscope Size: Mac and 4 Grade View: Grade III Tube type: Oral Tube size: 7.5 mm Number of attempts: 1 Airway Equipment and Method: Stylet and LTA kit utilized Placement Confirmation: ETT inserted through vocal cords under direct vision,  breath sounds checked- equal and bilateral,  positive ETCO2 and CO2 detector Secured at: 23 cm Tube secured with: Tape Dental Injury: Teeth and Oropharynx as per pre-operative assessment

## 2014-07-11 NOTE — Transfer of Care (Signed)
Immediate Anesthesia Transfer of Care Note  Patient: Victor Alexander  Procedure(s) Performed: Procedure(s): LAPAROSCOPIC UMBILICAL HERNIA REPAIR WITH MESH (N/A) INSERTION OF MESH (N/A)  Patient Location: PACU  Anesthesia Type:General  Level of Consciousness: awake, alert  and oriented  Airway & Oxygen Therapy: Patient Spontanous Breathing and Patient connected to nasal cannula oxygen  Post-op Assessment: Report given to PACU RN and Post -op Vital signs reviewed and stable  Post vital signs: Reviewed and stable  Complications: No apparent anesthesia complications

## 2014-07-11 NOTE — H&P (Signed)
History of Present Illness Victor Alexander(Victor Rosendahl MD; 05/20/2014 10:48 AM) Patient words: Evaluate for possible recurrent hernia.  The patient is a 43 year old male who presents for an evaluation of a hernia. The patient is a 43 year old male who previously had a laparoscopic umbilical hernia repair with mesh in 07/2012. The patient states thatrecently had 2 episodes of abdominal pain. He went to the ER was evaluatedat an outside hospital with a CT scan, Which revealed a recurrent hernia 2.5cm at the neck. The patient states that he has not had any pain otherwise.  Of note the patient has stated he had gained weight after surgery and has lost weight approximately 20 pounds after a weight loss program.   Review of Systems Victor Alexander(Victor Baglio, MD; 05/20/2014 10:46 00) General Not Present- Appetite Loss, Chills, Fatigue, Fever, Night Sweats, Weight Gain and Weight Loss. Skin Not Present- Change in Wart/Mole, Dryness, Hives, Jaundice, New Lesions, Non-Healing Wounds, Rash and Ulcer. HEENT Present- Seasonal Allergies. Not Present- Earache, Hearing Loss, Hoarseness, Nose Bleed, Oral Ulcers, Ringing in the Ears, Sinus Pain, Sore Throat, Visual Disturbances, Wears glasses/contact lenses and Yellow Eyes. Respiratory Present- Snoring. Not Present- Bloody sputum, Chronic Cough, Difficulty Breathing and Wheezing. Breast Not Present- Breast Mass, Breast Pain, Nipple Discharge and Skin Changes. Cardiovascular Not Present- Chest Pain, Difficulty Breathing Lying Down, Leg Cramps, Palpitations, Rapid Heart Rate, Shortness of Breath and Swelling of Extremities. Gastrointestinal Present- Constipation. Not Present- Abdominal Pain, Bloating, Bloody Stool, Change in Bowel Habits, Chronic diarrhea, Difficulty Swallowing, Excessive gas, Gets full quickly at meals, Hemorrhoids, Indigestion, Nausea, Rectal Pain and Vomiting. Male Genitourinary Not Present- Blood in Urine, Change in Urinary Stream, Frequency, Impotence, Nocturia,  Painful Urination, Urgency and Urine Leakage. Musculoskeletal Not Present- Back Pain, Joint Pain, Joint Stiffness, Muscle Pain, Muscle Weakness and Swelling of Extremities. Neurological Not Present- Decreased Memory, Fainting, Headaches, Numbness, Seizures, Tingling, Tremor, Trouble walking and Weakness. Psychiatric Not Present- Anxiety, Bipolar, Change in Sleep Pattern, Depression, Fearful and Frequent crying. Endocrine Not Present- Cold Intolerance, Excessive Hunger, Hair Changes, Heat Intolerance and New Diabetes. Hematology Not Present- Easy Bruising, Excessive bleeding, Gland problems, HIV and Persistent Infections.   Vitals Victor Alexander(Victor Moore MA; 05/20/2014 10:23 AM) 05/20/2014 10:23 AM Weight: 268.4 lb Height: 70in Body Surface Area: 2.45 m Body Mass Index: 38.51 kg/m Temp.: 97.26F(Temporal)  Pulse: 66 (Regular)  Resp.: 16 (Unlabored)  BP: 142/88 (Sitting, Left Arm, Standard)    Physical Exam Victor Alexander(Victor Miranda, MD; 05/20/2014 10:46 00) General Mental Status-Alert. General Appearance-Consistent with stated age. Hydration-Well hydrated. Voice-Normal.  Head and Neck Head-normocephalic, atraumatic with no lesions or palpable masses. Trachea-midline.  Eye Eyeball - Bilateral-Extraocular movements intact. Sclera/Conjunctiva - Bilateral-No scleral icterus.  Chest and Lung Exam Chest and lung exam reveals -quiet, even and easy respiratory effort with no use of accessory muscles, normal resonance, no flatness or dullness, non-tender and normal tactile fremitus and on auscultation, normal breath sounds, no adventitious sounds and normal vocal resonance. Inspection Chest Wall - Normal. Back - normal.  Cardiovascular Cardiovascular examination reveals -on palpation PMI is normal in location and amplitude, no palpable S3 or S4. Normal cardiac borders., normal heart sounds, regular rate and rhythm with no murmurs, carotid auscultation reveals no bruits  and normal pedal pulses bilaterally.  Abdomen Inspection Skin - Scar - no surgical scars. Hernias - Diastasis recti - Present. Ventral hernia - Reducible(peri umbilically). Palpation/Percussion Normal exam - Soft, Non Tender, No Rebound tenderness, No Rigidity (guarding) and No hepatosplenomegaly. Auscultation Normal exam - Bowel sounds normal.  Neurologic  Neurologic evaluation reveals -alert and oriented x 3 with no impairment of recent or remote memory. Mental Status-Normal.  Musculoskeletal Normal Exam - Left-Upper Extremity Strength Normal and Lower Extremity Strength Normal. Normal Exam - Right-Upper Extremity Strength Normal, Lower Extremity Weakness.    Assessment & Plan Victor Alexander(Victor Ghan MD; 05/20/2014 10:50 AM) RECURRENT UMBILICAL HERNIA (553.1  K42.9) Impression: 43 year old male with a recurrent umbilical hernia  The patient would like to proceed to the operating room for a laparoscopic hernia repair with mesh. All risks and benefits were discussed with the patient to generally include, but not limited to: infection, bleeding, damage to surrounding structures, acute and chronic nerve pain, and recurrence. Alternatives were offered and described. All questions were answered and the patient voiced understanding of the procedure and wishes to proceed at this point with hernia repair. Current Plans

## 2014-07-11 NOTE — Anesthesia Preprocedure Evaluation (Addendum)
Anesthesia Evaluation  Patient identified by MRN, date of birth, ID band Patient awake    Reviewed: Allergy & Precautions, H&P , NPO status , Patient's Chart, lab work & pertinent test results  Airway Mallampati: II  TM Distance: >3 FB Neck ROM: Full    Dental  (+) Teeth Intact, Dental Advisory Given   Pulmonary sleep apnea and Continuous Positive Airway Pressure Ventilation , former smoker,  CPAP every night- compliant         Cardiovascular Exercise Tolerance: Good negative cardio ROS      Neuro/Psych    GI/Hepatic negative GI ROS, Neg liver ROS,   Endo/Other  negative endocrine ROS  Renal/GU      Musculoskeletal   Abdominal   Peds  Hematology   Anesthesia Other Findings   Reproductive/Obstetrics                            Anesthesia Physical Anesthesia Plan  ASA: II  Anesthesia Plan: General   Post-op Pain Management:    Induction: Intravenous  Airway Management Planned: Oral ETT  Additional Equipment:   Intra-op Plan:   Post-operative Plan: Extubation in OR  Informed Consent: I have reviewed the patients History and Physical, chart, labs and discussed the procedure including the risks, benefits and alternatives for the proposed anesthesia with the patient or authorized representative who has indicated his/her understanding and acceptance.   Dental advisory given  Plan Discussed with: CRNA, Anesthesiologist and Surgeon  Anesthesia Plan Comments:        Anesthesia Quick Evaluation

## 2014-07-11 NOTE — Op Note (Signed)
07/11/2014  8:33 AM  PATIENT:  Victor Alexander  43 y.o. male  PRE-OPERATIVE DIAGNOSIS:  RECURRENT INCARCERATED UMBILICAL HERNIA  POST-OPERATIVE DIAGNOSIS:  RECURRENT INCARCERATED UMBILICAL HERNIA  PROCEDURE:  Procedure(s): LAPAROSCOPIC UMBILICAL HERNIA REPAIR WITH MESH (N/A) INSERTION OF MESH (N/A)  SURGEON:  Surgeon(s) and Role:    * Axel FillerArmando Nijah Orlich, MD - Primary  ASSISTANTS: none   ANESTHESIA:   local and general  EBL:  Total I/O In: 1000 [I.V.:1000] Out: -   BLOOD ADMINISTERED:none  DRAINS: none   LOCAL MEDICATIONS USED:  BUPIVICAINE   SPECIMEN:  No Specimen  DISPOSITION OF SPECIMEN:  N/A  COUNTS:  YES  TOURNIQUET:  * No tourniquets in log *  DICTATION: .Dragon Dictation Details of the procedure:   After the patient was consented patient was taken back to the operating room patient was then placed in supine position bilateral SCDs in place.  The patient was prepped and draped in the usual sterile fashion. After antibiotics were confirmed a timeout was called and all facts were verified. The Veress needle technique was used to insuflate the abdomen at Palmer's point. The abdomen was insufflated to 14 mm mercury. Subsequently a 5 mm trocar was placed a camera inserted there was no injury to any intra-abdominal organs.  There was seen to be a recurrent umbilical hernia just superior to the previously placed mesh.  It seemed as though the mesh has pulled away from the superior edge of the fixation.  There were omental adhensions to the underside of the mesh.  A second camera port was in placed into the left lower quadrant.   At this the Falicform ligament was taken down  with Bovie cautery maintaining hemostasis. I proceeded to reduce the hernia contents which consisted of omentum.  Once the hernia was cleared away, a Parietex PCO 12cm  mesh was inserted into the abdomen.  The mesh was secured circumferentially with a tacker in a double crown fashion. 2-0 prolenes were than  used to anchor the mesh in a transfascial fashion at the 12:00, 3:00, 6:00 and 9:00 positions. The omentum was brought over the area of the mesh. The pneumoperitoneum was evacuated  & all trocars  were removed. The skin was reapproximated with 4-0  Monocryl sutures in a subcuticular fashion. The skin was dressed with Steri-Strips tape and gauze.  The patient was taken to the recovery room in stable condition.    PLAN OF CARE: Discharge to home after PACU  PATIENT DISPOSITION:  PACU - hemodynamically stable.   Delay start of Pharmacological VTE agent (>24hrs) due to surgical blood loss or risk of bleeding: not applicable

## 2014-07-14 ENCOUNTER — Encounter (HOSPITAL_COMMUNITY): Payer: Self-pay | Admitting: General Surgery

## 2014-07-20 ENCOUNTER — Telehealth (INDEPENDENT_AMBULATORY_CARE_PROVIDER_SITE_OTHER): Payer: Self-pay | Admitting: General Surgery

## 2014-07-20 NOTE — Telephone Encounter (Signed)
Patient called stating area around incision has a bruise that has been enlarging over the past two days.  It is now the size of a volleyball and is associated with spasm like pain. He states that he is not taking any blood thinning medications or aspirin.  [place] I recommended that he be evaluated tomorrow at an urgent clinic.

## 2014-07-24 ENCOUNTER — Other Ambulatory Visit (HOSPITAL_COMMUNITY): Payer: Self-pay

## 2019-04-25 DIAGNOSIS — G4733 Obstructive sleep apnea (adult) (pediatric): Secondary | ICD-10-CM | POA: Diagnosis not present

## 2019-05-09 DIAGNOSIS — G4733 Obstructive sleep apnea (adult) (pediatric): Secondary | ICD-10-CM | POA: Diagnosis not present

## 2019-06-03 DIAGNOSIS — N23 Unspecified renal colic: Secondary | ICD-10-CM | POA: Diagnosis not present

## 2019-06-03 DIAGNOSIS — R300 Dysuria: Secondary | ICD-10-CM | POA: Diagnosis not present

## 2019-07-19 DIAGNOSIS — B9789 Other viral agents as the cause of diseases classified elsewhere: Secondary | ICD-10-CM | POA: Diagnosis not present

## 2019-07-19 DIAGNOSIS — J019 Acute sinusitis, unspecified: Secondary | ICD-10-CM | POA: Diagnosis not present

## 2019-08-01 DIAGNOSIS — U071 COVID-19: Secondary | ICD-10-CM | POA: Diagnosis not present

## 2019-09-20 DIAGNOSIS — N200 Calculus of kidney: Secondary | ICD-10-CM | POA: Diagnosis not present

## 2019-09-20 DIAGNOSIS — R109 Unspecified abdominal pain: Secondary | ICD-10-CM | POA: Diagnosis not present

## 2019-09-20 DIAGNOSIS — Z91030 Bee allergy status: Secondary | ICD-10-CM | POA: Diagnosis not present

## 2019-09-23 DIAGNOSIS — R1010 Upper abdominal pain, unspecified: Secondary | ICD-10-CM | POA: Diagnosis not present

## 2019-09-28 DIAGNOSIS — N200 Calculus of kidney: Secondary | ICD-10-CM | POA: Diagnosis not present

## 2019-09-28 DIAGNOSIS — M5137 Other intervertebral disc degeneration, lumbosacral region: Secondary | ICD-10-CM | POA: Diagnosis not present

## 2019-09-28 DIAGNOSIS — M545 Low back pain: Secondary | ICD-10-CM | POA: Diagnosis not present

## 2019-09-28 DIAGNOSIS — K5900 Constipation, unspecified: Secondary | ICD-10-CM | POA: Diagnosis not present

## 2019-09-28 DIAGNOSIS — Z91030 Bee allergy status: Secondary | ICD-10-CM | POA: Diagnosis not present

## 2019-09-28 DIAGNOSIS — R109 Unspecified abdominal pain: Secondary | ICD-10-CM | POA: Diagnosis not present

## 2019-10-02 DIAGNOSIS — N200 Calculus of kidney: Secondary | ICD-10-CM | POA: Diagnosis not present

## 2019-10-03 DIAGNOSIS — M549 Dorsalgia, unspecified: Secondary | ICD-10-CM | POA: Diagnosis not present

## 2019-10-03 DIAGNOSIS — M545 Low back pain: Secondary | ICD-10-CM | POA: Diagnosis not present

## 2019-10-18 DIAGNOSIS — M549 Dorsalgia, unspecified: Secondary | ICD-10-CM | POA: Diagnosis not present

## 2019-10-18 DIAGNOSIS — M545 Low back pain: Secondary | ICD-10-CM | POA: Diagnosis not present

## 2019-10-24 DIAGNOSIS — E669 Obesity, unspecified: Secondary | ICD-10-CM | POA: Diagnosis not present

## 2019-10-24 DIAGNOSIS — Z13220 Encounter for screening for lipoid disorders: Secondary | ICD-10-CM | POA: Diagnosis not present

## 2019-10-24 DIAGNOSIS — R030 Elevated blood-pressure reading, without diagnosis of hypertension: Secondary | ICD-10-CM | POA: Diagnosis not present

## 2019-10-24 DIAGNOSIS — G4733 Obstructive sleep apnea (adult) (pediatric): Secondary | ICD-10-CM | POA: Diagnosis not present

## 2019-10-31 DIAGNOSIS — M545 Low back pain: Secondary | ICD-10-CM | POA: Diagnosis not present

## 2019-11-11 DIAGNOSIS — M545 Low back pain: Secondary | ICD-10-CM | POA: Diagnosis not present

## 2019-11-11 DIAGNOSIS — M549 Dorsalgia, unspecified: Secondary | ICD-10-CM | POA: Diagnosis not present

## 2019-11-15 DIAGNOSIS — M545 Low back pain: Secondary | ICD-10-CM | POA: Diagnosis not present

## 2019-12-09 DIAGNOSIS — M546 Pain in thoracic spine: Secondary | ICD-10-CM | POA: Diagnosis not present

## 2019-12-09 DIAGNOSIS — M545 Low back pain: Secondary | ICD-10-CM | POA: Diagnosis not present

## 2019-12-23 DIAGNOSIS — M546 Pain in thoracic spine: Secondary | ICD-10-CM | POA: Diagnosis not present

## 2019-12-23 DIAGNOSIS — M545 Low back pain: Secondary | ICD-10-CM | POA: Diagnosis not present

## 2020-01-20 DIAGNOSIS — M546 Pain in thoracic spine: Secondary | ICD-10-CM | POA: Diagnosis not present

## 2020-01-20 DIAGNOSIS — M545 Low back pain: Secondary | ICD-10-CM | POA: Diagnosis not present

## 2020-02-11 DIAGNOSIS — G4733 Obstructive sleep apnea (adult) (pediatric): Secondary | ICD-10-CM | POA: Diagnosis not present

## 2020-06-12 DIAGNOSIS — L989 Disorder of the skin and subcutaneous tissue, unspecified: Secondary | ICD-10-CM | POA: Diagnosis not present

## 2020-07-15 DIAGNOSIS — G4733 Obstructive sleep apnea (adult) (pediatric): Secondary | ICD-10-CM | POA: Diagnosis not present

## 2020-07-15 DIAGNOSIS — Z87442 Personal history of urinary calculi: Secondary | ICD-10-CM | POA: Diagnosis not present

## 2020-07-15 DIAGNOSIS — R5383 Other fatigue: Secondary | ICD-10-CM | POA: Diagnosis not present

## 2020-07-15 DIAGNOSIS — R7301 Impaired fasting glucose: Secondary | ICD-10-CM | POA: Diagnosis not present

## 2020-07-15 DIAGNOSIS — R6882 Decreased libido: Secondary | ICD-10-CM | POA: Diagnosis not present

## 2020-08-04 DIAGNOSIS — L419 Parapsoriasis, unspecified: Secondary | ICD-10-CM | POA: Diagnosis not present

## 2020-08-04 DIAGNOSIS — L821 Other seborrheic keratosis: Secondary | ICD-10-CM | POA: Diagnosis not present

## 2020-08-04 DIAGNOSIS — D485 Neoplasm of uncertain behavior of skin: Secondary | ICD-10-CM | POA: Diagnosis not present

## 2020-08-04 DIAGNOSIS — C44619 Basal cell carcinoma of skin of left upper limb, including shoulder: Secondary | ICD-10-CM | POA: Diagnosis not present

## 2020-10-04 DIAGNOSIS — H109 Unspecified conjunctivitis: Secondary | ICD-10-CM | POA: Diagnosis not present

## 2020-10-04 DIAGNOSIS — H579 Unspecified disorder of eye and adnexa: Secondary | ICD-10-CM | POA: Diagnosis not present

## 2020-10-07 DIAGNOSIS — C44619 Basal cell carcinoma of skin of left upper limb, including shoulder: Secondary | ICD-10-CM | POA: Diagnosis not present

## 2020-12-07 DIAGNOSIS — L905 Scar conditions and fibrosis of skin: Secondary | ICD-10-CM | POA: Diagnosis not present

## 2021-04-13 DIAGNOSIS — G4733 Obstructive sleep apnea (adult) (pediatric): Secondary | ICD-10-CM | POA: Diagnosis not present

## 2021-06-03 DIAGNOSIS — M25572 Pain in left ankle and joints of left foot: Secondary | ICD-10-CM | POA: Diagnosis not present

## 2021-06-03 DIAGNOSIS — M722 Plantar fascial fibromatosis: Secondary | ICD-10-CM | POA: Diagnosis not present

## 2022-08-01 ENCOUNTER — Ambulatory Visit
Admission: EM | Admit: 2022-08-01 | Discharge: 2022-08-01 | Disposition: A | Discharge status: Home / Self Care | Payer: BC Managed Care – PPO | Attending: Family Medicine | Admitting: Family Medicine

## 2022-08-01 DIAGNOSIS — H10021 Other mucopurulent conjunctivitis, right eye: Secondary | ICD-10-CM

## 2022-08-01 HISTORY — DX: Pure hypercholesterolemia, unspecified: E78.00

## 2022-08-01 MED ORDER — TOBRAMYCIN 0.3 % OP SOLN: 1.0000 [drp] | OPHTHALMIC | 0 refills | Status: AC

## 2022-08-01 NOTE — Discharge Instructions (Signed)
Use the eyedrops 4 times a day If not better in a week see your primary care doctor or an eye doctor

## 2022-08-01 NOTE — ED Triage Notes (Signed)
Pt presents with rt eye redness x 1.5 weeks. Pt denies itching or burning

## 2022-08-01 NOTE — ED Provider Notes (Signed)
[name]    CSN: [id number] Arrival date & time: 08/01/22  1453      History   Chief Complaint Chief Complaint  Patient presents with   eye redness    HPI Victor Alexander is a 52 y.o. male.   HPI  Right eye has been red for a week and a half.  Minimally irritating.  No discharge.  No crusting.  No vision changes.  No trauma.  No allergies.  No recent illness or virus.  Past Medical History:  Diagnosis Date   Constipation    Hernia, umbilical    High cholesterol    Sleep apnea    wears CPAP nightly    There are no problems to display for this patient.   Past Surgical History:  Procedure Laterality Date   APPENDECTOMY     HERNIA REPAIR  56/8/12   Lap Umbilical hernia repair   INSERTION OF MESH N/A 07/11/2014   Procedure: INSERTION OF MESH;  Surgeon: Ralene Ok, MD;  Location: [facility];  Service: General;  Laterality: N/A;   UMBILICAL HERNIA REPAIR N/A 07/11/2014   Procedure: LAPAROSCOPIC UMBILICAL HERNIA REPAIR WITH MESH;  Surgeon: Ralene Ok, MD;  Location: [facility];  Service: General;  Laterality: N/A;       Home Medications    Prior to Admission medications   Medication Sig Start Date End Date Taking? Authorizing Provider  metformin (FORTAMET) 500 MG (OSM) 24 hr tablet Take 500 mg by mouth daily with breakfast.   Yes [provider]  tobramycin (TOBREX) 0.3 % ophthalmic solution Place 1 drop into the right eye every 4 (four) hours. 08/01/22  Yes Raylene Everts, MD    Family History History reviewed. No pertinent family history.  Social History Social History   Tobacco Use   Smoking status: Former  Substance Use Topics   Alcohol use: Yes    Comment: once per month   Drug use: No     Allergies   Patient has no known allergies.   Review of Systems Review of Systems  See HPI Physical Exam Triage Vital Signs ED Triage Vitals  Enc Vitals Group     BP 08/01/22 1600 (!) 160/91     Pulse Rate 08/01/22 1600 66      Resp 08/01/22 1600 14     Temp 08/01/22 1600 97.9 F (36.6 C)     Temp Source 08/01/22 1600 Oral     SpO2 08/01/22 1600 98 %     Weight --      Height --      Head Circumference --      Peak Flow --      Pain Score 08/01/22 1558 0     Pain Loc --      Pain Edu? --      Excl. in [place]? --    No data found.  Updated Vital Signs BP (!) 160/91 (BP Location: Left Arm)   Pulse 66   Temp 97.9 F (36.6 C) (Oral)   Resp 14   SpO2 98%      Physical Exam Constitutional:      General: He is not in acute distress.    Appearance: He is well-developed.  HENT:     Head: Normocephalic and atraumatic.  Eyes:     General: Lids are normal. Lids are everted, no foreign bodies appreciated. Vision grossly intact. Gaze aligned appropriately.        Right eye: No foreign body, discharge or  hordeolum.     Conjunctiva/sclera:     Right eye: Right conjunctiva is injected.     Pupils: Pupils are equal, round, and reactive to light.  Cardiovascular:     Rate and Rhythm: Normal rate.  Pulmonary:     Effort: Pulmonary effort is normal. No respiratory distress.  Abdominal:     General: There is no distension.     Palpations: Abdomen is soft.  Musculoskeletal:        General: Normal range of motion.     Cervical back: Normal range of motion.  Skin:    General: Skin is warm and dry.  Neurological:     Mental Status: He is alert.      UC Treatments / Results  Labs (all labs ordered are listed, but only abnormal results are displayed) Labs Reviewed - No data to display  EKG   Radiology No results found.  Procedures Procedures (including critical care time)  Medications Ordered in UC Medications - No data to display  Initial Impression / Assessment and Plan / UC Course  I have reviewed the triage vital signs and the nursing notes.  Pertinent labs & imaging results that were available during my care of the patient were reviewed by me and considered in my medical decision making  (see chart for details).     Final Clinical Impressions(s) / UC Diagnoses   Final diagnoses:  Pink eye disease of right eye     Discharge Instructions      Use the eyedrops 4 times a day If not better in a week see your primary care doctor or an eye doctor   ED Prescriptions     Medication Sig Dispense Auth. Provider   tobramycin (TOBREX) 0.3 % ophthalmic solution Place 1 drop into the right eye every 4 (four) hours. 5 mL Raylene Everts, MD      PDMP not reviewed this encounter.   Raylene Everts, MD 08/01/22 [phone]
